# Patient Record
Sex: Female | Born: 2000 | Hispanic: Yes | Marital: Single | Smoking: Never smoker
Health system: Southern US, Community
[De-identification: ages and names within clinical notes are randomized; demographics above are authoritative.]

## PROBLEM LIST (undated history)

## (undated) HISTORY — PX: OTHER SURGICAL HISTORY: SHX169

---
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
## 2022-06-19 ENCOUNTER — Encounter: Payer: Self-pay | Admitting: Nurse Practitioner

## 2022-06-19 ENCOUNTER — Ambulatory Visit (LOCAL_COMMUNITY_HEALTH_CENTER): Payer: Medicaid Other | Admitting: Nurse Practitioner

## 2022-06-19 VITALS — BP 104/69 | Ht 59.0 in | Wt 159.0 lb

## 2022-06-19 DIAGNOSIS — Z113 Encounter for screening for infections with a predominantly sexual mode of transmission: Secondary | ICD-10-CM

## 2022-06-19 DIAGNOSIS — Z3009 Encounter for other general counseling and advice on contraception: Secondary | ICD-10-CM | POA: Diagnosis not present

## 2022-06-19 DIAGNOSIS — Z30017 Encounter for initial prescription of implantable subdermal contraceptive: Secondary | ICD-10-CM | POA: Diagnosis not present

## 2022-06-19 LAB — HM HIV SCREENING LAB: HM HIV Screening: NEGATIVE

## 2022-06-19 MED ORDER — ETONOGESTREL 68 MG ~~LOC~~ IMPL
68.0000 mg | DRUG_IMPLANT | Freq: Once | SUBCUTANEOUS | Status: AC
  Administered 2022-06-19: 68 mg via SUBCUTANEOUS

## 2022-06-19 NOTE — Progress Notes (Signed)
Patient here for PE and BCM. Would like STD testing today and states she is about 80% sure she wants Nexplanon for BCM.Marland KitchenBurt Knack, RN

## 2022-06-19 NOTE — Progress Notes (Signed)
Nexplanon Insertion Procedure Patient identified, informed consent performed, consent signed.   Patient does understand that irregular bleeding is a very common side effect of this medication. She was advised to have backup contraception after placement. Patient was determined to meet WHO criteria for not being pregnant. Appropriate time out taken.  The insertion site was identified 8-10 cm (3-4 inches) from the medial epicondyle of the humerus and 3-5 cm (1.25-2 inches) posterior to (below) the sulcus (groove) between the biceps and triceps muscles of the patient's left arm and marked.  Patient was prepped with alcohol swab and then injected with 3 ml of 1% lidocaine.  Arm was prepped with chlorhexidene, Nexplanon removed from packaging,  Device confirmed in needle, then inserted full length of needle and withdrawn per handbook instructions. Nexplanon was able to palpated in the patient's arm; patient palpated the insert herself. There was minimal blood loss.  Patient insertion site covered with guaze and a pressure bandage to reduce any bruising.  The patient tolerated the procedure well and was given post procedure instructions.  Denesha Brouse, FNP  

## 2022-06-19 NOTE — Progress Notes (Signed)
Wet mount reviewed, no treatment indicated. Nexplanon insertion today.Burt Knack, RN

## 2022-06-19 NOTE — Progress Notes (Signed)
[facility] DEPARTMENT [facility] [address] Main Number: [phone]    Family Planning Visit- Initial Visit  Subjective:  Wanda Finley is a 21 y.o.  G0P0000   being seen today for an initial annual visit and to discuss reproductive life planning.  The patient is currently using Female Condom for pregnancy prevention. Patient reports   does not want a pregnancy in the next year.     report they are looking for a method that provides High efficacy at preventing pregnancy  Patient has the following medical conditions does not have a problem list on file.  Chief Complaint  Patient presents with   Annual Exam   Contraception    Patient reports to clinic for a physical, STD screening, and birth control.      Body mass index is 32.11 kg/m. - Patient is eligible for diabetes screening based on BMI and age >41?  not applicable HA1C ordered? not applicable  Patient reports 1  partner/s in last year. Desires STI screening?  Yes  Has patient been screened once for HCV in the past?  No  No results found for: "HCVAB"  Does the patient have current drug use (including MJ), have a partner with drug use, and/or has been incarcerated since last result? No  If yes-- Screen for HCV through [facility]   Does the patient meet criteria for HBV testing? No  Criteria:  -Household, sexual or needle sharing contact with HBV -History of drug use -HIV positive -Those with known Hep C   Health Maintenance Due  Topic Date Due   COVID-19 Vaccine (1) Never done   HPV VACCINES (1 - 2-dose series) Never done   HIV Screening  Never done   Hepatitis C Screening  Never done   INFLUENZA VACCINE  06/12/2022    Review of Systems  Constitutional:  Negative for chills, fever, malaise/fatigue and weight loss.       Weight fluctuation   HENT:  Negative for congestion, hearing loss and sore throat.   Eyes:  Negative for blurred vision, double vision and  photophobia.  Respiratory:  Negative for shortness of breath.   Cardiovascular:  Negative for chest pain.  Gastrointestinal:  Negative for abdominal pain, blood in stool, constipation, diarrhea, heartburn, nausea and vomiting.  Genitourinary:  Negative for dysuria and frequency.  Musculoskeletal:  Negative for back pain, joint pain and neck pain.  Skin:  Negative for itching and rash.  Neurological:  Positive for headaches. Negative for dizziness and weakness.  Endo/Heme/Allergies:  Does not bruise/bleed easily.  Psychiatric/Behavioral:  Negative for depression, substance abuse and suicidal ideas.     The following portions of the patient's history were reviewed and updated as appropriate: allergies, current medications, past family history, past medical history, past social history, past surgical history and problem list. Problem list updated.   See flowsheet for other program required questions.  Objective:   Vitals:   06/19/22 1537  BP: 104/69  Weight: 159 lb (72.1 kg)  Height: 4\' 11"  (1.499 m)    Physical Exam Constitutional:      Appearance: Normal appearance.  HENT:     Head: Normocephalic. No abrasion, masses or laceration. Hair is normal.     Jaw: No tenderness or swelling.     Right Ear: External ear normal.     Left Ear: External ear normal.     Nose: Nose normal.     Mouth/Throat:     Lips: Pink. No  lesions.     Mouth: Mucous membranes are moist. No lacerations or oral lesions.     Dentition: No dental caries.     Tongue: No lesions.     Palate: No mass and lesions.     Pharynx: No pharyngeal swelling, oropharyngeal exudate, posterior oropharyngeal erythema or uvula swelling.     Tonsils: No tonsillar exudate or tonsillar abscesses.  Eyes:     Pupils: Pupils are equal, round, and reactive to light.  Neck:     Thyroid: No thyroid mass, thyromegaly or thyroid tenderness.  Cardiovascular:     Rate and Rhythm: Normal rate and regular rhythm.  Pulmonary:      Effort: Pulmonary effort is normal.     Breath sounds: Normal breath sounds.  Abdominal:     General: Abdomen is flat. Bowel sounds are normal.     Palpations: Abdomen is soft.     Tenderness: There is no abdominal tenderness. There is no rebound.  Genitourinary:    Pubic Area: No rash or pubic lice.      Labia:        Right: No rash, tenderness or lesion.        Left: No rash, tenderness or lesion.      Vagina: Normal. No vaginal discharge, erythema, tenderness or lesions.     Cervix: No cervical motion tenderness, discharge, lesion or erythema.     Uterus: Normal.      Adnexa:        Right: No tenderness.         Left: No tenderness.       Rectum: Normal.     Comments: Amount Discharge: small  Odor: No pH: less than 4.5 Adheres to vaginal wall: No Color: color of discharge matches the Abdirizak Richison swab  Musculoskeletal:     Cervical back: Full passive range of motion without pain and normal range of motion.  Lymphadenopathy:     Cervical: No cervical adenopathy.     Right cervical: No superficial, deep or posterior cervical adenopathy.    Left cervical: No superficial, deep or posterior cervical adenopathy.     Upper Body:     Right upper body: No supraclavicular, axillary or epitrochlear adenopathy.     Left upper body: No supraclavicular, axillary or epitrochlear adenopathy.     Lower Body: No right inguinal adenopathy. No left inguinal adenopathy.  Skin:    General: Skin is warm and dry.     Findings: No erythema, laceration, lesion or rash.  Neurological:     Mental Status: She is alert and oriented to person, place, and time.  Psychiatric:        Attention and Perception: Attention normal.        Mood and Affect: Mood normal.        Speech: Speech normal.        Behavior: Behavior normal. Behavior is cooperative.       Assessment and Plan:  Wanda Finley is a 21 y.o. female presenting to the [facility] Department for an initial annual wellness/contraceptive  visit  Contraception counseling: Reviewed options based on patient desire and reproductive life plan. Patient is interested in Hormonal Implant. This was provided to the patient today.   Risks, benefits, and typical effectiveness rates were reviewed.  Questions were answered.  Written information was also given to the patient to review.    The patient will follow up in  1 years for surveillance.  The patient was told to call with  any further questions, or with any concerns about this method of contraception.  Emphasized use of condoms 100% of the time for STI prevention.  Need for ECP was assessed. ECP not offered due to reported last sexual encounter.    1. Family planning -21 year old female in clinic today for a physical, STD screening, and birth control.   -ROS reviewed, patient reports headaches and weight fluctuation.  Ruled out with patient headaches being migraines.  Patient reports that headaches are managed with rest and medication.  Encouraged to get proper rest, eat frequent small meals high in protein, exercise at least 3 times a week, and drink 6-8 bottles of water.  -Patient interested in a Nexplanon as a birth control method.    - etonogestrel (NEXPLANON) implant 68 mg  2. Screening examination for venereal disease -STD screening today. -Patient accepted all screenings including vaginal CT/GC, wet prep and bloodwork for HIV/RPR.  Patient meets criteria for HepB screening? No. Ordered? No - low risk  Patient meets criteria for HepC screening? No. Ordered? No - low risk   Treat wet prep per standing order Discussed time line for [facility] results and that patient will be called with positive results and encouraged patient to call if she had not heard in 2 weeks.  Counseled to return or seek care for continued or worsening symptoms Recommended condom use with all sex  Patient is currently using  condoms  to prevent pregnancy.    - WET PREP FOR TRICH, YEAST, CLUE -  Chlamydia/Gonorrhea [place] [facility] - HIV [place] [facility] - Syphilis Serology, [place] [facility]  3. Nexplanon insertion -See Nexplanon note - etonogestrel (NEXPLANON) implant 68 mg   Total time spent: 40 minutes   Return in about 1 year (around 06/20/2023) for Annual well-woman exam.    Glenna Fellows, FNP

## 2022-06-20 LAB — WET PREP FOR TRICH, YEAST, CLUE
Trichomonas Exam: NEGATIVE
Yeast Exam: NEGATIVE

## 2024-05-26 ENCOUNTER — Ambulatory Visit: Admitting: Physician Assistant

## 2024-05-26 ENCOUNTER — Encounter: Payer: Self-pay | Admitting: Physician Assistant

## 2024-05-26 ENCOUNTER — Ambulatory Visit

## 2024-05-26 VITALS — BP 105/60 | Ht 59.0 in | Wt 168.2 lb

## 2024-05-26 DIAGNOSIS — Z309 Encounter for contraceptive management, unspecified: Secondary | ICD-10-CM | POA: Diagnosis not present

## 2024-05-26 DIAGNOSIS — Z0000 Encounter for general adult medical examination without abnormal findings: Secondary | ICD-10-CM

## 2024-05-26 DIAGNOSIS — N644 Mastodynia: Secondary | ICD-10-CM

## 2024-05-26 DIAGNOSIS — Z3009 Encounter for other general counseling and advice on contraception: Secondary | ICD-10-CM

## 2024-05-26 NOTE — Progress Notes (Signed)
 [facility] DEPARTMENT [facility] [address] [place] [address] Main phone: [phone]  Family Planning Visit - Initial Visit  Subjective:  Wanda Finley is a 23 y.o.  G0P0000   being seen today for an initial annual visit and to discuss reproductive life planning.  The patient is currently using hormonal implant for pregnancy prevention. Patient does not want a pregnancy in the next year.   Patient reports they are looking for a method with the following characteristics:  High efficacy at preventing pregnancy  Patient has the following medical conditions: There are no active problems to display for this patient.   Chief Complaint  Patient presents with   Annual Exam    HPI Patient reports breast tenderness. This started on the left above her breast 1 mo ago, and now she also has right lateral breast tenderness for 2 weeks. 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 Does not have menses with Nexplanon  (inserted almost 2 years ago.) No associated N/V. Has tried ibuprofen 200mg  daily with some relief of the breast tenderness. Had similar pain below her breasts a while ago which resolved after several months. Does have some headaches.   Patient denies other concerns. Likes Nexplanon  and wants to continue.   Review of Systems  Constitutional: Negative.   HENT: Negative.    Eyes: Negative.   Respiratory: Negative.    Cardiovascular: Negative.   Gastrointestinal: Negative.   Genitourinary: Negative.   Musculoskeletal: Negative.   Skin: Negative.   Neurological:  Positive for headaches.  Endo/Heme/Allergies: Negative.   Psychiatric/Behavioral: Negative.      Diabetes screening This patient is 23 y.o. with a BMI of Body mass index is 33.97 kg/m.SABRA  Is patient eligible for diabetes screening (age >35 and BMI >25)?  no  Was Hgb A1c ordered? not  applicable  STI screening Patient reports 1 of partners in last year.  Does this patient desire STI screening?  No - declines  Hepatitis C screening Has patient been screened once for HCV in the past?  No  No results found for: HCVAB  Does the patient meet criteria for HCV testing? No  (If yes-- Screen for HCV through [facility]) Criteria:  Since the last HCV result, does the patient have any of the following? - Current drug use - Have a partner with drug use - Has been incarcerated  Hepatitis B screening Does the patient meet criteria for HBV testing? No Criteria:  -Household, sexual or needle sharing contact with HBV -History of drug use -HIV positive -Those with known Hep C  Cervical Cancer Screening  No Cervical Cancer Screening results to display.  Health Maintenance Due  Topic Date Due   CHLAMYDIA SCREENING  Never done   HPV VACCINES (1 - 3-dose series) Never done   Meningococcal B Vaccine (1 of 2 - Standard) Never done   Hepatitis C Screening  Never done   DTaP/Tdap/Td (1 - Tdap) Never done   Hepatitis B Vaccines (1 of 3 - 19+ 3-dose series) Never done   COVID-19 Vaccine (1 - 2024-25 season) Never done    The following portions of the patient's history were reviewed and updated as appropriate: allergies, current medications, past family history, past medical history, past social history, past surgical history and problem list. Problem list updated.  See flowsheet for further details and programmatic requirements Hyperlink available at the top of the  signed note in blue.  Flow sheet content below:  Pregnancy Intention Screening Does the patient want to become pregnant in the next year?: No Does the patient's partner want to become pregnant in the next year?: No Would the patient like to discuss contraceptive options today?: No Results Follow up Password: [email or web address] Contraception History Past methods of contraception used by patient:: Hormonal Implant Adverse  effects associated with Hormonal Implant: none Sexual History What age did you start your period?: 16 How often do you have your period?: monthly (no periods with nexplanon ) Date of last sex?: 05/20/24 Has the patient had unprotected sex within the last 5 days?: No Do you have sex with men, women, both men and women?: Men only In the past 2 months how many partners have you had sex with?: 1 In the past 12 months, how many partners have you had sex with?: 1 Is it possible that any of your sex partners in the past 12 months had sex with someone else whild they were still in a sexual relationship with you?: No What ways do you have sex?: Vaginal Do you or your partner use condoms and/or dental dams every time you have vaginal, oral or anal sex?: Sometimes Do you douche?: No Date of last HIV test?: 06/19/22 Have you ever had an STD?: No Have any of your partners had an STD?: No Have you or your partner ever shot up drugs?: No Have any of your partners used drugs in the past?: No Have you or your partners exchanged money or drugs for sex?: No Contraception Wrap Up Current Method: Hormonal Implant End Method: Hormonal Implant Contraception Counseling Provided: Yes How was the end contraceptive method provided?: N/A  Objective:   Vitals:   05/26/24 1527  BP: 105/60  Weight: 168 lb 3.2 oz (76.3 kg)  Height: 4' 11 (1.499 m)    Physical Exam Vitals and nursing note reviewed. Exam conducted with a chaperone present Hershall Parish, Charity fundraiser).  Constitutional:      Appearance: Normal appearance. She is obese.  HENT:     Head: Normocephalic.     Mouth/Throat:     Mouth: Mucous membranes are moist.  Cardiovascular:     Rate and Rhythm: Normal rate.  Pulmonary:     Effort: Pulmonary effort is normal.  Chest:     Chest wall: No mass, lacerations, deformity, swelling or tenderness.  Breasts:    Tanner Score is 5.     Right: No swelling, inverted nipple, mass, nipple discharge, skin  change or tenderness.     Left: No swelling, inverted nipple, mass, nipple discharge, skin change or tenderness.     Comments: Bilateral large breasts Abdominal:     Palpations: Abdomen is soft.  Genitourinary:    Comments: Declined genital exam- no symptoms, self swabbed Musculoskeletal:        General: Normal range of motion.  Lymphadenopathy:     Head:     Right side of head: No submandibular, preauricular or posterior auricular adenopathy.     Left side of head: No submandibular, preauricular or posterior auricular adenopathy.     Cervical: No cervical adenopathy.     Upper Body:     Right upper body: No supraclavicular, axillary or pectoral adenopathy.     Left upper body: No supraclavicular, axillary or pectoral adenopathy.  Skin:    General: Skin is warm and dry.     Comments: Thin rod palpable under skin L medial upper arm c/w Nexplanon  implant.  Neurological:  Mental Status: She is alert and oriented to person, place, and time.  Psychiatric:        Mood and Affect: Mood normal.     Assessment and Plan:  Wanda Finley is a 23 y.o. female presenting to the [facility] Department for an initial annual wellness/contraceptive visit and to discuss breast tenderness.  Contraception counseling:  Reviewed options based on patient desire and reproductive life plan. Patient is interested in Hormonal Implant. This was provided (continued) for the patient today.   Risks, benefits, and typical effectiveness rates were reviewed.  Questions were answered.  Written information was also given to the patient to review.    The patient will follow up in  1 years for surveillance.  The patient was told to call with any further questions, or with any concerns about this method of contraception.  Emphasized use of condoms 100% of the time for STI prevention.  Emergency Contraception Precautions (ECP): Patient assessed for need of ECP. She is not a candidate based on LARC in place  and unexpired .   1. Encounter for well woman exam without gynecological exam (Primary) Encouraged to have PCP and dental home visits. First Pap due age 66 for routine cervical cancer screening.  2. Family planning Continue Nexplanon , should be effective at least 2 more years for contraception. Recommend condoms for STI prevention.  3. Breast tenderness Unclear etiology. Breast exam is normal, no tenderness or mass noted on exam today. Continue supportive bra. Suggest trial of ibuprofen 600 mg every 6-8 hours for 1 week. If sx worsen or persist, f/u with PCP.   Return in about 1 year (around 05/26/2025) for Annual well-woman exam.  No future appointments.  Cleaster Shiffer, PA-C

## 2024-05-26 NOTE — Progress Notes (Signed)
 Here for PE and breast pain both sides. Has Nexplanon , inserted 06/19/2022. SABRAIzetta Parish, RN
